# Patient Record
Sex: Female | Born: 2000 | Race: White | Hispanic: No | Marital: Single | Smoking: Never smoker
Health system: Southern US, Community
[De-identification: ages and names within clinical notes are randomized; demographics above are authoritative.]

---
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
## 2020-03-13 ENCOUNTER — Other Ambulatory Visit: Payer: Self-pay | Admitting: Sports Medicine
